# Patient Record
Sex: Female | Born: 1940 | Race: White | Hispanic: No | Marital: Married | Smoking: Former smoker
Health system: Southern US, Community
[De-identification: ages and names within clinical notes are randomized; demographics above are authoritative.]

## PROBLEM LIST (undated history)

## (undated) DIAGNOSIS — J302 Other seasonal allergic rhinitis: Secondary | ICD-10-CM

## (undated) DIAGNOSIS — E7800 Pure hypercholesterolemia, unspecified: Secondary | ICD-10-CM

## (undated) DIAGNOSIS — I10 Essential (primary) hypertension: Secondary | ICD-10-CM

## (undated) DIAGNOSIS — R7989 Other specified abnormal findings of blood chemistry: Secondary | ICD-10-CM

## (undated) DIAGNOSIS — K219 Gastro-esophageal reflux disease without esophagitis: Secondary | ICD-10-CM

## (undated) HISTORY — PX: ABDOMINAL HYSTERECTOMY: SHX81

## (undated) HISTORY — PX: TONSILLECTOMY: SUR1361

---

## 1980-09-21 HISTORY — PX: TUBAL LIGATION: SHX77

## 2014-03-09 ENCOUNTER — Other Ambulatory Visit: Payer: Self-pay | Admitting: Neurosurgery

## 2014-03-14 ENCOUNTER — Inpatient Hospital Stay (HOSPITAL_COMMUNITY): Admission: RE | Admit: 2014-03-14 | Payer: Self-pay | Source: Ambulatory Visit

## 2014-03-28 ENCOUNTER — Encounter (HOSPITAL_COMMUNITY)
Admission: RE | Admit: 2014-03-28 | Discharge: 2014-03-28 | Disposition: A | Discharge status: Home / Self Care | Payer: MEDICARE | Source: Ambulatory Visit | Attending: Neurosurgery | Admitting: Neurosurgery

## 2014-03-28 ENCOUNTER — Ambulatory Visit (HOSPITAL_COMMUNITY)
Admission: RE | Admit: 2014-03-28 | Discharge: 2014-03-28 | Disposition: A | Discharge status: Home / Self Care | Payer: MEDICARE | Source: Ambulatory Visit | Attending: Anesthesiology | Admitting: Anesthesiology

## 2014-03-28 ENCOUNTER — Encounter (HOSPITAL_COMMUNITY): Payer: Self-pay

## 2014-03-28 DIAGNOSIS — I10 Essential (primary) hypertension: Secondary | ICD-10-CM | POA: Insufficient documentation

## 2014-03-28 DIAGNOSIS — Z01818 Encounter for other preprocedural examination: Secondary | ICD-10-CM | POA: Insufficient documentation

## 2014-03-28 HISTORY — DX: Essential (primary) hypertension: I10

## 2014-03-28 HISTORY — DX: Other seasonal allergic rhinitis: J30.2

## 2014-03-28 HISTORY — DX: Gastro-esophageal reflux disease without esophagitis: K21.9

## 2014-03-28 HISTORY — DX: Other specified abnormal findings of blood chemistry: R79.89

## 2014-03-28 HISTORY — DX: Pure hypercholesterolemia, unspecified: E78.00

## 2014-03-28 LAB — CBC
HEMATOCRIT: 34.7 % — AB (ref 36.0–46.0)
HEMOGLOBIN: 11.1 g/dL — AB (ref 12.0–15.0)
MCH: 29.5 pg (ref 26.0–34.0)
MCHC: 32 g/dL (ref 30.0–36.0)
MCV: 92.3 fL (ref 78.0–100.0)
Platelets: 235 10*3/uL (ref 150–400)
RBC: 3.76 MIL/uL — AB (ref 3.87–5.11)
RDW: 14 % (ref 11.5–15.5)
WBC: 5.7 10*3/uL (ref 4.0–10.5)

## 2014-03-28 LAB — SURGICAL PCR SCREEN
MRSA, PCR: NEGATIVE
Staphylococcus aureus: POSITIVE — AB

## 2014-03-28 LAB — BASIC METABOLIC PANEL
Anion gap: 12 (ref 5–15)
BUN: 32 mg/dL — ABNORMAL HIGH (ref 6–23)
CALCIUM: 9.4 mg/dL (ref 8.4–10.5)
CHLORIDE: 102 meq/L (ref 96–112)
CO2: 25 meq/L (ref 19–32)
Creatinine, Ser: 1.42 mg/dL — ABNORMAL HIGH (ref 0.50–1.10)
GFR calc Af Amer: 42 mL/min — ABNORMAL LOW (ref >90)
GFR calc non Af Amer: 36 mL/min — ABNORMAL LOW (ref >90)
GLUCOSE: 87 mg/dL (ref 70–99)
Potassium: 4.2 mEq/L (ref 3.7–5.3)
SODIUM: 139 meq/L (ref 137–147)

## 2014-03-28 NOTE — Progress Notes (Signed)
Requested EKG, Stress Test, Echo, Last OV from [facility] [place] [place]

## 2014-03-28 NOTE — Pre-Procedure Instructions (Addendum)
Phyllis Richardson  03/28/2014   Your procedure is scheduled on:  June 14  [facility] Admitting at 05:30 AM.  Call this number if you have problems the morning of surgery: [phone]   Remember:   Do not eat food or drink liquids after midnight.   Take these medicines the morning of surgery with A SIP OF WATER: Amlodipine, Carvedilol, Nexium, Zyrtec, Advair, Proventil (if needed), Veremyst (if needed) Ultram (if needed)   STOP Aspirin, Mobic, Calcium, Excedrin, Vitamin D today   STOP/ Do not take Aspirin, Aleve, Naproxen, Advil, Ibuprofen, Motrin, Vitamins, Herbs, or Supplements starting today    Do not wear jewelry, make-up or nail polish.  Do not wear lotions, powders, or perfumes. You may wear deodorant.  Do not shave 48 hours prior to surgery. Men may shave face and neck.  Do not bring valuables to the hospital.  [facility] is not responsible for any belongings or valuables.               Contacts, dentures or bridgework may not be worn into surgery.  Leave suitcase in the car. After surgery it may be brought to your room.  For patients admitted to the hospital, discharge time is determined by your treatment team.               Special Instructions: See [facility] Preparing For Surgery   Please read over the following fact sheets that you were given: Pain Booklet, Coughing and Deep Breathing and Surgical Site Infection Prevention

## 2014-04-02 MED ORDER — CEFAZOLIN SODIUM-DEXTROSE 2-3 GM-% IV SOLR
2.0000 g | INTRAVENOUS | Status: AC
  Administered 2014-04-03: 2 g via INTRAVENOUS

## 2014-04-03 ENCOUNTER — Observation Stay (HOSPITAL_COMMUNITY)
Admission: RE | Admit: 2014-04-03 | Discharge: 2014-04-04 | Disposition: A | Discharge status: Home / Self Care | Payer: MEDICARE | Source: Ambulatory Visit | Attending: Neurosurgery | Admitting: Neurosurgery

## 2014-04-03 ENCOUNTER — Encounter (HOSPITAL_COMMUNITY)
Admission: RE | Disposition: A | Discharge status: Home / Self Care | Payer: Self-pay | Source: Ambulatory Visit | Attending: Neurosurgery

## 2014-04-03 ENCOUNTER — Encounter (HOSPITAL_COMMUNITY): Payer: MEDICARE | Admitting: Anesthesiology

## 2014-04-03 ENCOUNTER — Ambulatory Visit (HOSPITAL_COMMUNITY): Payer: MEDICARE | Admitting: Anesthesiology

## 2014-04-03 ENCOUNTER — Observation Stay (HOSPITAL_COMMUNITY): Payer: MEDICARE

## 2014-04-03 ENCOUNTER — Encounter (HOSPITAL_COMMUNITY): Payer: Self-pay | Admitting: *Deleted

## 2014-04-03 DIAGNOSIS — Z87891 Personal history of nicotine dependence: Secondary | ICD-10-CM | POA: Insufficient documentation

## 2014-04-03 DIAGNOSIS — Z7982 Long term (current) use of aspirin: Secondary | ICD-10-CM | POA: Insufficient documentation

## 2014-04-03 DIAGNOSIS — D759 Disease of blood and blood-forming organs, unspecified: Secondary | ICD-10-CM | POA: Insufficient documentation

## 2014-04-03 DIAGNOSIS — K219 Gastro-esophageal reflux disease without esophagitis: Secondary | ICD-10-CM | POA: Diagnosis not present

## 2014-04-03 DIAGNOSIS — I10 Essential (primary) hypertension: Secondary | ICD-10-CM | POA: Insufficient documentation

## 2014-04-03 DIAGNOSIS — E7800 Pure hypercholesterolemia, unspecified: Secondary | ICD-10-CM | POA: Diagnosis not present

## 2014-04-03 DIAGNOSIS — M4722 Other spondylosis with radiculopathy, cervical region: Secondary | ICD-10-CM | POA: Diagnosis present

## 2014-04-03 DIAGNOSIS — M47812 Spondylosis without myelopathy or radiculopathy, cervical region: Principal | ICD-10-CM | POA: Insufficient documentation

## 2014-04-03 HISTORY — PX: ANTERIOR CERVICAL DECOMP/DISCECTOMY FUSION: SHX1161

## 2014-04-03 SURGERY — ANTERIOR CERVICAL DECOMPRESSION/DISCECTOMY FUSION 2 LEVELS
Anesthesia: General | Site: Spine Cervical

## 2014-04-03 MED ORDER — FLUTICASONE PROPIONATE 50 MCG/ACT NA SUSP: 2.0000 | Freq: Every day | NASAL | Status: DC | PRN

## 2014-04-03 MED ORDER — GLYCOPYRROLATE 0.2 MG/ML IJ SOLN
INTRAMUSCULAR | Status: DC | PRN
  Administered 2014-04-03: 0.6 mg via INTRAVENOUS

## 2014-04-03 MED ORDER — CARVEDILOL 12.5 MG PO TABS
12.5000 mg | ORAL_TABLET | Freq: Two times a day (BID) | ORAL | Status: DC
  Administered 2014-04-03 – 2014-04-04 (×2): 12.5 mg via ORAL
  Filled 2014-04-03 (×4): qty 1

## 2014-04-03 MED ORDER — DEXAMETHASONE SODIUM PHOSPHATE 10 MG/ML IJ SOLN
INTRAMUSCULAR | Status: AC
  Filled 2014-04-03: qty 1

## 2014-04-03 MED ORDER — CEFAZOLIN SODIUM 1-5 GM-% IV SOLN
1.0000 g | Freq: Three times a day (TID) | INTRAVENOUS | Status: AC
  Administered 2014-04-03 (×2): 1 g via INTRAVENOUS
  Filled 2014-04-03 (×2): qty 50

## 2014-04-03 MED ORDER — ONDANSETRON HCL 4 MG/2ML IJ SOLN
INTRAMUSCULAR | Status: AC
  Filled 2014-04-03: qty 2

## 2014-04-03 MED ORDER — SENNA 8.6 MG PO TABS
1.0000 | ORAL_TABLET | Freq: Two times a day (BID) | ORAL | Status: DC
  Administered 2014-04-03 – 2014-04-04 (×4): 8.6 mg via ORAL
  Filled 2014-04-03 (×3): qty 1

## 2014-04-03 MED ORDER — HYDROMORPHONE HCL PF 1 MG/ML IJ SOLN
INTRAMUSCULAR | Status: AC
  Filled 2014-04-03: qty 1

## 2014-04-03 MED ORDER — DOCUSATE SODIUM 100 MG PO CAPS
100.0000 mg | ORAL_CAPSULE | Freq: Two times a day (BID) | ORAL | Status: DC
  Administered 2014-04-03 – 2014-04-04 (×4): 100 mg via ORAL
  Filled 2014-04-03 (×4): qty 1

## 2014-04-03 MED ORDER — PHENYLEPHRINE HCL 10 MG/ML IJ SOLN
INTRAMUSCULAR | Status: DC | PRN
  Administered 2014-04-03 (×7): 40 ug via INTRAVENOUS

## 2014-04-03 MED ORDER — BACITRACIN 50000 UNITS IM SOLR
INTRAMUSCULAR | Status: DC | PRN
  Administered 2014-04-03: 09:00:00

## 2014-04-03 MED ORDER — PHENYLEPHRINE 40 MCG/ML (10ML) SYRINGE FOR IV PUSH (FOR BLOOD PRESSURE SUPPORT)
PREFILLED_SYRINGE | INTRAVENOUS | Status: AC
  Filled 2014-04-03: qty 10

## 2014-04-03 MED ORDER — EPHEDRINE SULFATE 50 MG/ML IJ SOLN
INTRAMUSCULAR | Status: DC | PRN
  Administered 2014-04-03: 5 mg via INTRAVENOUS
  Administered 2014-04-03: 10 mg via INTRAVENOUS
  Administered 2014-04-03 (×3): 5 mg via INTRAVENOUS

## 2014-04-03 MED ORDER — ALBUTEROL SULFATE (2.5 MG/3ML) 0.083% IN NEBU: 2.5000 mg | INHALATION_SOLUTION | Freq: Four times a day (QID) | RESPIRATORY_TRACT | Status: DC | PRN

## 2014-04-03 MED ORDER — VECURONIUM BROMIDE 10 MG IV SOLR
INTRAVENOUS | Status: DC | PRN
  Administered 2014-04-03: 1 mg via INTRAVENOUS
  Administered 2014-04-03: 2 mg via INTRAVENOUS

## 2014-04-03 MED ORDER — OXYCODONE HCL 5 MG/5ML PO SOLN: 5.0000 mg | Freq: Once | ORAL | Status: DC | PRN

## 2014-04-03 MED ORDER — CALCIUM CARBONATE 600 MG PO TABS
600.0000 mg | ORAL_TABLET | Freq: Two times a day (BID) | ORAL | Status: DC
  Filled 2014-04-03 (×2): qty 1

## 2014-04-03 MED ORDER — LIDOCAINE HCL (CARDIAC) 20 MG/ML IV SOLN
INTRAVENOUS | Status: DC | PRN
  Administered 2014-04-03: 100 mg via INTRAVENOUS

## 2014-04-03 MED ORDER — PROMETHAZINE HCL 25 MG/ML IJ SOLN: 6.2500 mg | INTRAMUSCULAR | Status: DC | PRN

## 2014-04-03 MED ORDER — DIAZEPAM 5 MG PO TABS: 5.0000 mg | ORAL_TABLET | Freq: Four times a day (QID) | ORAL | Status: AC | PRN

## 2014-04-03 MED ORDER — VITAMIN D (ERGOCALCIFEROL) 1.25 MG (50000 UNIT) PO CAPS
50000.0000 [IU] | ORAL_CAPSULE | ORAL | Status: DC
  Filled 2014-04-03: qty 1

## 2014-04-03 MED ORDER — ROCURONIUM BROMIDE 50 MG/5ML IV SOLN
INTRAVENOUS | Status: AC
  Filled 2014-04-03: qty 1

## 2014-04-03 MED ORDER — GLYCOPYRROLATE 0.2 MG/ML IJ SOLN
INTRAMUSCULAR | Status: AC
  Filled 2014-04-03: qty 1

## 2014-04-03 MED ORDER — LOSARTAN POTASSIUM-HCTZ 100-12.5 MG PO TABS: 1.0000 | ORAL_TABLET | Freq: Every day | ORAL | Status: DC

## 2014-04-03 MED ORDER — DEXAMETHASONE SODIUM PHOSPHATE 10 MG/ML IJ SOLN
INTRAMUSCULAR | Status: DC | PRN
  Administered 2014-04-03: 10 mg via INTRAVENOUS

## 2014-04-03 MED ORDER — ASPIRIN EC 81 MG PO TBEC
81.0000 mg | DELAYED_RELEASE_TABLET | Freq: Every day | ORAL | Status: AC
Start: 2014-04-13 — End: ?

## 2014-04-03 MED ORDER — NEOSTIGMINE METHYLSULFATE 10 MG/10ML IV SOLN
INTRAVENOUS | Status: AC
  Filled 2014-04-03: qty 1

## 2014-04-03 MED ORDER — OXYCODONE HCL 5 MG PO TABS: 5.0000 mg | ORAL_TABLET | Freq: Once | ORAL | Status: DC | PRN

## 2014-04-03 MED ORDER — MIDAZOLAM HCL 2 MG/2ML IJ SOLN
INTRAMUSCULAR | Status: AC
  Filled 2014-04-03: qty 2

## 2014-04-03 MED ORDER — EZETIMIBE 10 MG PO TABS
10.0000 mg | ORAL_TABLET | Freq: Every day | ORAL | Status: DC
  Administered 2014-04-03 – 2014-04-04 (×2): 10 mg via ORAL
  Filled 2014-04-03 (×2): qty 1

## 2014-04-03 MED ORDER — GLYCOPYRROLATE 0.2 MG/ML IJ SOLN
INTRAMUSCULAR | Status: AC
  Filled 2014-04-03: qty 3

## 2014-04-03 MED ORDER — MELOXICAM 7.5 MG PO TABS
7.5000 mg | ORAL_TABLET | Freq: Every day | ORAL | Status: DC
  Administered 2014-04-03 – 2014-04-04 (×2): 7.5 mg via ORAL
  Filled 2014-04-03 (×2): qty 1

## 2014-04-03 MED ORDER — STERILE WATER FOR INJECTION IJ SOLN
INTRAMUSCULAR | Status: AC
  Filled 2014-04-03: qty 10

## 2014-04-03 MED ORDER — THROMBIN 5000 UNITS EX SOLR
OROMUCOSAL | Status: DC | PRN
  Administered 2014-04-03: 09:00:00 via TOPICAL

## 2014-04-03 MED ORDER — PANTOPRAZOLE SODIUM 40 MG PO TBEC
80.0000 mg | DELAYED_RELEASE_TABLET | Freq: Every day | ORAL | Status: DC
  Administered 2014-04-04: 80 mg via ORAL
  Filled 2014-04-03: qty 2

## 2014-04-03 MED ORDER — ALBUTEROL SULFATE HFA 108 (90 BASE) MCG/ACT IN AERS: 2.0000 | INHALATION_SPRAY | Freq: Four times a day (QID) | RESPIRATORY_TRACT | Status: DC | PRN

## 2014-04-03 MED ORDER — SODIUM CHLORIDE 0.9 % IJ SOLN
INTRAMUSCULAR | Status: AC
  Filled 2014-04-03: qty 10

## 2014-04-03 MED ORDER — MOMETASONE FURO-FORMOTEROL FUM 100-5 MCG/ACT IN AERO
2.0000 | INHALATION_SPRAY | Freq: Two times a day (BID) | RESPIRATORY_TRACT | Status: DC
  Administered 2014-04-03 – 2014-04-04 (×3): 2 via RESPIRATORY_TRACT
  Filled 2014-04-03: qty 8.8

## 2014-04-03 MED ORDER — PROPOFOL 10 MG/ML IV BOLUS
INTRAVENOUS | Status: AC
  Filled 2014-04-03: qty 20

## 2014-04-03 MED ORDER — OXYCODONE-ACETAMINOPHEN 5-325 MG PO TABS
ORAL_TABLET | ORAL | Status: AC
  Filled 2014-04-03: qty 2

## 2014-04-03 MED ORDER — ROCURONIUM BROMIDE 100 MG/10ML IV SOLN
INTRAVENOUS | Status: DC | PRN
  Administered 2014-04-03: 10 mg via INTRAVENOUS
  Administered 2014-04-03: 40 mg via INTRAVENOUS

## 2014-04-03 MED ORDER — AMLODIPINE BESYLATE 5 MG PO TABS
5.0000 mg | ORAL_TABLET | Freq: Every day | ORAL | Status: DC
  Administered 2014-04-04: 5 mg via ORAL
  Filled 2014-04-03: qty 1

## 2014-04-03 MED ORDER — PROPOFOL 10 MG/ML IV BOLUS
INTRAVENOUS | Status: DC | PRN
  Administered 2014-04-03: 130 mg via INTRAVENOUS

## 2014-04-03 MED ORDER — HYDROCHLOROTHIAZIDE 12.5 MG PO CAPS
12.5000 mg | ORAL_CAPSULE | Freq: Every day | ORAL | Status: DC
  Administered 2014-04-03 – 2014-04-04 (×2): 12.5 mg via ORAL
  Filled 2014-04-03 (×2): qty 1

## 2014-04-03 MED ORDER — EPHEDRINE SULFATE 50 MG/ML IJ SOLN
INTRAMUSCULAR | Status: AC
  Filled 2014-04-03: qty 1

## 2014-04-03 MED ORDER — HEPARIN SODIUM (PORCINE) 5000 UNIT/ML IJ SOLN
5000.0000 [IU] | Freq: Three times a day (TID) | INTRAMUSCULAR | Status: DC
  Administered 2014-04-04 (×2): 5000 [IU] via SUBCUTANEOUS
  Filled 2014-04-03 (×4): qty 1

## 2014-04-03 MED ORDER — THROMBIN 20000 UNITS EX SOLR
CUTANEOUS | Status: DC | PRN
  Administered 2014-04-03: 09:00:00 via TOPICAL

## 2014-04-03 MED ORDER — FENOFIBRATE 54 MG PO TABS
54.0000 mg | ORAL_TABLET | Freq: Every day | ORAL | Status: DC
  Administered 2014-04-03 – 2014-04-04 (×2): 54 mg via ORAL
  Filled 2014-04-03 (×2): qty 1

## 2014-04-03 MED ORDER — ACETAMINOPHEN 650 MG RE SUPP: 650.0000 mg | RECTAL | Status: DC | PRN

## 2014-04-03 MED ORDER — MIDAZOLAM HCL 5 MG/5ML IJ SOLN
INTRAMUSCULAR | Status: DC | PRN
  Administered 2014-04-03: 1 mg via INTRAVENOUS

## 2014-04-03 MED ORDER — 0.9 % SODIUM CHLORIDE (POUR BTL) OPTIME
TOPICAL | Status: DC | PRN
  Administered 2014-04-03: 1000 mL

## 2014-04-03 MED ORDER — LOSARTAN POTASSIUM 50 MG PO TABS
100.0000 mg | ORAL_TABLET | Freq: Every day | ORAL | Status: DC
  Administered 2014-04-03 – 2014-04-04 (×2): 100 mg via ORAL
  Filled 2014-04-03 (×2): qty 2

## 2014-04-03 MED ORDER — LORATADINE 10 MG PO TABS
10.0000 mg | ORAL_TABLET | Freq: Every day | ORAL | Status: DC
  Administered 2014-04-03 – 2014-04-04 (×2): 10 mg via ORAL
  Filled 2014-04-03 (×2): qty 1

## 2014-04-03 MED ORDER — SUCCINYLCHOLINE CHLORIDE 20 MG/ML IJ SOLN
INTRAMUSCULAR | Status: AC
  Filled 2014-04-03: qty 1

## 2014-04-03 MED ORDER — LIDOCAINE HCL (CARDIAC) 20 MG/ML IV SOLN
INTRAVENOUS | Status: AC
  Filled 2014-04-03: qty 5

## 2014-04-03 MED ORDER — MENTHOL 3 MG MT LOZG
1.0000 | LOZENGE | OROMUCOSAL | Status: DC | PRN
  Filled 2014-04-03: qty 9

## 2014-04-03 MED ORDER — SODIUM CHLORIDE 0.9 % IV SOLN: 250.0000 mL | INTRAVENOUS | Status: DC

## 2014-04-03 MED ORDER — OXYCODONE-ACETAMINOPHEN 5-325 MG PO TABS
1.0000 | ORAL_TABLET | ORAL | Status: DC | PRN
  Administered 2014-04-03 – 2014-04-04 (×3): 2 via ORAL
  Filled 2014-04-03 (×2): qty 2

## 2014-04-03 MED ORDER — OXYCODONE-ACETAMINOPHEN 5-325 MG PO TABS: 1.0000 | ORAL_TABLET | ORAL | Status: AC | PRN

## 2014-04-03 MED ORDER — ONDANSETRON HCL 4 MG/2ML IJ SOLN
INTRAMUSCULAR | Status: DC | PRN
  Administered 2014-04-03: 4 mg via INTRAVENOUS

## 2014-04-03 MED ORDER — DIAZEPAM 5 MG PO TABS
5.0000 mg | ORAL_TABLET | Freq: Four times a day (QID) | ORAL | Status: DC | PRN
Start: 2014-04-03 — End: 2014-04-05
  Administered 2014-04-03 – 2014-04-04 (×3): 5 mg via ORAL
  Filled 2014-04-03 (×2): qty 1

## 2014-04-03 MED ORDER — SODIUM CHLORIDE 0.9 % IJ SOLN
3.0000 mL | Freq: Two times a day (BID) | INTRAMUSCULAR | Status: DC
  Administered 2014-04-03: 3 mL via INTRAVENOUS

## 2014-04-03 MED ORDER — ONDANSETRON HCL 4 MG/2ML IJ SOLN
4.0000 mg | INTRAMUSCULAR | Status: DC | PRN
Start: 2014-04-03 — End: 2014-04-05

## 2014-04-03 MED ORDER — ACETAMINOPHEN 325 MG PO TABS: 650.0000 mg | ORAL_TABLET | ORAL | Status: DC | PRN

## 2014-04-03 MED ORDER — FENTANYL CITRATE 0.05 MG/ML IJ SOLN
INTRAMUSCULAR | Status: DC | PRN
  Administered 2014-04-03: 50 ug via INTRAVENOUS
  Administered 2014-04-03: 25 ug via INTRAVENOUS
  Administered 2014-04-03: 50 ug via INTRAVENOUS

## 2014-04-03 MED ORDER — SODIUM CHLORIDE 0.9 % IV SOLN: INTRAVENOUS | Status: DC

## 2014-04-03 MED ORDER — FENTANYL CITRATE 0.05 MG/ML IJ SOLN
INTRAMUSCULAR | Status: AC
  Filled 2014-04-03: qty 5

## 2014-04-03 MED ORDER — CALCIUM CARBONATE 1250 (500 CA) MG PO TABS
1.0000 | ORAL_TABLET | Freq: Two times a day (BID) | ORAL | Status: DC
  Administered 2014-04-03 – 2014-04-04 (×2): 500 mg via ORAL
  Filled 2014-04-03 (×4): qty 1

## 2014-04-03 MED ORDER — HYDROMORPHONE HCL PF 1 MG/ML IJ SOLN
0.2500 mg | INTRAMUSCULAR | Status: DC | PRN
  Administered 2014-04-03: 0.25 mg via INTRAVENOUS

## 2014-04-03 MED ORDER — LACTATED RINGERS IV SOLN
INTRAVENOUS | Status: DC | PRN
  Administered 2014-04-03 (×2): via INTRAVENOUS

## 2014-04-03 MED ORDER — DIAZEPAM 5 MG PO TABS
ORAL_TABLET | ORAL | Status: AC
  Filled 2014-04-03: qty 1

## 2014-04-03 MED ORDER — PHENOL 1.4 % MT LIQD
1.0000 | OROMUCOSAL | Status: DC | PRN
  Administered 2014-04-03: 1 via OROMUCOSAL
  Filled 2014-04-03: qty 177

## 2014-04-03 MED ORDER — LIDOCAINE-EPINEPHRINE 1 %-1:100000 IJ SOLN
INTRAMUSCULAR | Status: DC | PRN
  Administered 2014-04-03: 4 mL

## 2014-04-03 MED ORDER — NEOSTIGMINE METHYLSULFATE 10 MG/10ML IV SOLN
INTRAVENOUS | Status: DC | PRN
  Administered 2014-04-03: 4 mg via INTRAVENOUS

## 2014-04-03 MED ORDER — BUPIVACAINE HCL 0.5 % IJ SOLN
INTRAMUSCULAR | Status: DC | PRN
  Administered 2014-04-03: 4 mL

## 2014-04-03 MED ORDER — SODIUM CHLORIDE 0.9 % IJ SOLN: 3.0000 mL | INTRAMUSCULAR | Status: DC | PRN

## 2014-04-03 SURGICAL SUPPLY — 69 items
BAG DECANTER FOR FLEXI CONT (MISCELLANEOUS) ×3 IMPLANT
BENZOIN TINCTURE PRP APPL 2/3 (GAUZE/BANDAGES/DRESSINGS) ×3 IMPLANT
BLADE 10 SAFETY STRL DISP (BLADE) IMPLANT
BLADE SURG 11 STRL SS (BLADE) ×3 IMPLANT
BLADE SURG ROTATE 9660 (MISCELLANEOUS) IMPLANT
BNDG GAUZE ELAST 4 BULKY (GAUZE/BANDAGES/DRESSINGS) IMPLANT
BUR MATCHSTICK NEURO 3.0 LAGG (BURR) ×3 IMPLANT
CAGE PEEK VISTA-S 11X14X5MM (Cage) ×3 IMPLANT
CAGE PEEK VISTA-S CERV 11X14X5 (Cage) ×3 IMPLANT
CANISTER SUCT 3000ML (MISCELLANEOUS) ×3 IMPLANT
CLOSURE WOUND 1/2 X4 (GAUZE/BANDAGES/DRESSINGS) ×1
CONT SPEC 4OZ CLIKSEAL STRL BL (MISCELLANEOUS) ×3 IMPLANT
DECANTER SPIKE VIAL GLASS SM (MISCELLANEOUS) ×3 IMPLANT
DERMABOND ADHESIVE PROPEN (GAUZE/BANDAGES/DRESSINGS) ×2
DERMABOND ADVANCED .7 DNX6 (GAUZE/BANDAGES/DRESSINGS) ×1 IMPLANT
DRAIN CHANNEL 10M FLAT 3/4 FLT (DRAIN) IMPLANT
DRAPE C-ARM 42X72 X-RAY (DRAPES) ×6 IMPLANT
DRAPE LAPAROTOMY 100X72 PEDS (DRAPES) ×3 IMPLANT
DRAPE MICROSCOPE LEICA (MISCELLANEOUS) ×3 IMPLANT
DRAPE POUCH INSTRU U-SHP 10X18 (DRAPES) ×3 IMPLANT
DRAPE PROXIMA HALF (DRAPES) ×3 IMPLANT
DRSG OPSITE POSTOP 3X4 (GAUZE/BANDAGES/DRESSINGS) ×3 IMPLANT
DRSG TEGADERM 4X4.75 (GAUZE/BANDAGES/DRESSINGS) IMPLANT
DURAPREP 6ML APPLICATOR 50/CS (WOUND CARE) ×3 IMPLANT
ELECT COATED BLADE 2.86 ST (ELECTRODE) ×3 IMPLANT
ELECT REM PT RETURN 9FT ADLT (ELECTROSURGICAL) ×3
ELECTRODE REM PT RTRN 9FT ADLT (ELECTROSURGICAL) ×1 IMPLANT
EVACUATOR SILICONE 100CC (DRAIN) IMPLANT
GAUZE SPONGE 4X4 16PLY XRAY LF (GAUZE/BANDAGES/DRESSINGS) IMPLANT
GLOVE BIOGEL PI IND STRL 7.0 (GLOVE) ×2 IMPLANT
GLOVE BIOGEL PI IND STRL 7.5 (GLOVE) ×1 IMPLANT
GLOVE BIOGEL PI INDICATOR 7.0 (GLOVE) ×4
GLOVE BIOGEL PI INDICATOR 7.5 (GLOVE) ×2
GLOVE ECLIPSE 7.0 STRL STRAW (GLOVE) ×3 IMPLANT
GLOVE ECLIPSE 8.0 STRL XLNG CF (GLOVE) ×3 IMPLANT
GLOVE EXAM NITRILE LRG STRL (GLOVE) IMPLANT
GLOVE EXAM NITRILE MD LF STRL (GLOVE) IMPLANT
GLOVE EXAM NITRILE XL STR (GLOVE) IMPLANT
GLOVE EXAM NITRILE XS STR PU (GLOVE) IMPLANT
GLOVE SURG SS PI 7.0 STRL IVOR (GLOVE) ×9 IMPLANT
GOWN STRL REUS W/ TWL LRG LVL3 (GOWN DISPOSABLE) ×2 IMPLANT
GOWN STRL REUS W/ TWL XL LVL3 (GOWN DISPOSABLE) ×2 IMPLANT
GOWN STRL REUS W/TWL 2XL LVL3 (GOWN DISPOSABLE) IMPLANT
GOWN STRL REUS W/TWL LRG LVL3 (GOWN DISPOSABLE) ×4
GOWN STRL REUS W/TWL XL LVL3 (GOWN DISPOSABLE) ×4
HEMOSTAT POWDER KIT SURGIFOAM (HEMOSTASIS) ×3 IMPLANT
KIT BASIN OR (CUSTOM PROCEDURE TRAY) ×3 IMPLANT
KIT ROOM TURNOVER OR (KITS) ×3 IMPLANT
NEEDLE HYPO 25X1 1.5 SAFETY (NEEDLE) ×3 IMPLANT
NEEDLE SPNL 22GX3.5 QUINCKE BK (NEEDLE) ×3 IMPLANT
NS IRRIG 1000ML POUR BTL (IV SOLUTION) ×3 IMPLANT
PACK LAMINECTOMY NEURO (CUSTOM PROCEDURE TRAY) ×3 IMPLANT
PAD ARMBOARD 7.5X6 YLW CONV (MISCELLANEOUS) ×12 IMPLANT
PLATE INVIZIA 2 LEV 38MM (Plate) ×3 IMPLANT
PUTTY BONE GRAFT KIT 2.5ML (Bone Implant) ×3 IMPLANT
RUBBERBAND STERILE (MISCELLANEOUS) ×6 IMPLANT
SCREW SELF DRILL VAR 12MM (Screw) ×18 IMPLANT
SPONGE INTESTINAL PEANUT (DISPOSABLE) ×3 IMPLANT
SPONGE SURGIFOAM ABS GEL 100 (HEMOSTASIS) ×3 IMPLANT
STRIP CLOSURE SKIN 1/2X4 (GAUZE/BANDAGES/DRESSINGS) ×2 IMPLANT
SUT ETHILON 3 0 FSL (SUTURE) IMPLANT
SUT VIC AB 3-0 SH 8-18 (SUTURE) ×3 IMPLANT
SUT VICRYL 3-0 RB1 18 ABS (SUTURE) ×6 IMPLANT
SYR 20ML ECCENTRIC (SYRINGE) ×3 IMPLANT
TAPE CLOTH 3X10 TAN LF (GAUZE/BANDAGES/DRESSINGS) ×3 IMPLANT
TOWEL OR 17X24 6PK STRL BLUE (TOWEL DISPOSABLE) ×3 IMPLANT
TOWEL OR 17X26 10 PK STRL BLUE (TOWEL DISPOSABLE) ×3 IMPLANT
TRAP SPECIMEN MUCOUS 40CC (MISCELLANEOUS) ×3 IMPLANT
WATER STERILE IRR 1000ML POUR (IV SOLUTION) ×3 IMPLANT

## 2014-04-03 NOTE — Anesthesia Postprocedure Evaluation (Signed)
  Anesthesia Post-op Note  Patient: Phyllis Richardson  Procedure(s) Performed: Procedure(s): CERVICAL FIVE-SIX,CERVICAL SIX-SEVEN ANTERIOR CERVICAL DECOMPRESSION WITH FUSION INTERBODY PROSTHESIS PLATING AND BONEGRAFT. (N/A)  Patient Location: PACU  Anesthesia Type:General  Level of Consciousness: awake and alert   Airway and Oxygen Therapy: Patient Spontanous Breathing  Post-op Pain: mild  Post-op Assessment: Post-op Vital signs reviewed  Post-op Vital Signs: stable  Last Vitals:  Filed Vitals:   04/03/14 1145  BP:   Pulse: 81  Temp: 36.7 C  Resp: 15    Complications: No apparent anesthesia complications

## 2014-04-03 NOTE — Anesthesia Procedure Notes (Signed)
Procedure Name: Intubation Date/Time: 04/03/2014 7:37 AM Performed by: Lovie CholOCK, Emmersyn Kratzke K Pre-anesthesia Checklist: Patient identified, Emergency Drugs available, Suction available, Patient being monitored and Timeout performed Patient Re-evaluated:Patient Re-evaluated prior to inductionOxygen Delivery Method: Circle system utilized Preoxygenation: Pre-oxygenation with 100% oxygen Intubation Type: IV induction Ventilation: Mask ventilation without difficulty and Oral airway inserted - appropriate to patient size Laryngoscope Size: Miller and 2 Grade View: Grade I Tube type: Oral Tube size: 7.0 mm Number of attempts: 1 Airway Equipment and Method: Stylet Placement Confirmation: ETT inserted through vocal cords under direct vision,  positive ETCO2,  CO2 detector and breath sounds checked- equal and bilateral Secured at: 21 cm Tube secured with: Tape Dental Injury: Teeth and Oropharynx as per pre-operative assessment

## 2014-04-03 NOTE — Discharge Summary (Signed)
Physician Discharge Summary  Patient ID: Phyllis Richardson MRN: [id number] DOB/AGE: [birth year] 73 y.o.  Admit date: 04/03/2014 Discharge date: 04/03/2014  Admission Diagnoses: Cervical spondylosis with radiculopathy, C5-6, C6-7  Discharge Diagnoses: Same Active Problems:   Cervical spondylosis with radiculopathy   Discharged Condition: Stable  Hospital Course:  Phyllis Richardson is a 73 y.o. female electively admitted after uncomplicated C5-6 ACDF. She was at her neurologic baseline postoperatively, reporting resolution of her left neck and shoulder pain and decreased numbness in her left hand. She is tolerating diet, voiding normally, and ambulating and therefore stable for D/C.  Treatments: Surgery - ACDF C5-6, C6-7  Discharge Exam: Blood pressure 121/68, pulse 88, temperature 97.7 F (36.5 C), temperature source Oral, resp. rate 16, height 5\' 3"  (1.6 m), weight 87.998 kg (194 lb), SpO2 94.00%. Awake, alert, oriented Speech fluent, appropriate CN grossly intact 5/5 BUE/BLE x 4/5 Left tricep Wound c/d/i  Follow-up: Follow-up in my office [facility] [phone]) in 2-3 weeks  Disposition: Home     Medication List         albuterol 108 (90 BASE) MCG/ACT inhaler  Commonly known as:  PROVENTIL HFA;VENTOLIN HFA  Inhale 2 puffs into the lungs every 6 (six) hours as needed for wheezing or shortness of breath.     amLODipine 5 MG tablet  Commonly known as:  NORVASC  Take 5 mg by mouth daily.     aspirin EC 81 MG tablet  Take 1 tablet (81 mg total) by mouth daily.  Start taking on:  04/13/2014     calcium carbonate 600 MG Tabs tablet  Commonly known as:  OS-CAL  Take 600 mg by mouth 2 (two) times daily with a meal.     carvedilol 12.5 MG tablet  Commonly known as:  COREG  Take 12.5 mg by mouth 2 (two) times daily with a meal.     cetirizine 10 MG tablet  Commonly known as:  ZYRTEC  Take 10 mg by mouth daily.     diazepam 5 MG tablet  Commonly  known as:  VALIUM  Take 1 tablet (5 mg total) by mouth every 6 (six) hours as needed for muscle spasms.     esomeprazole 40 MG capsule  Commonly known as:  NEXIUM  Take 40 mg by mouth daily at 12 noon.     EXCEDRIN PO  Take 2 tablets by mouth 2 (two) times daily as needed.     ezetimibe 10 MG tablet  Commonly known as:  ZETIA  Take 10 mg by mouth at bedtime.     fenofibrate 54 MG tablet  Take 54 mg by mouth at bedtime.     fluticasone 27.5 MCG/SPRAY nasal spray  Commonly known as:  VERAMYST  Place 2 sprays into the nose daily as needed for rhinitis.     Fluticasone-Salmeterol 250-50 MCG/DOSE Aepb  Commonly known as:  ADVAIR  Inhale 1 puff into the lungs 2 (two) times daily.     HYDROcodone-acetaminophen 5-325 MG per tablet  Commonly known as:  NORCO/VICODIN  Take 1 tablet by mouth every 6 (six) hours as needed for moderate pain.     losartan-hydrochlorothiazide 100-12.5 MG per tablet  Commonly known as:  HYZAAR  Take 1 tablet by mouth daily.     meloxicam 7.5 MG tablet  Commonly known as:  MOBIC  Take 7.5 mg by mouth daily.     oxyCODONE-acetaminophen 5-325 MG per tablet  Commonly known as:  PERCOCET/ROXICET  Take 1-2 tablets  by mouth every 4 (four) hours as needed for moderate pain.     PRESCRIPTION MEDICATION  Inject 1 application into the muscle once a week. Allergy injection     PRESCRIPTION MEDICATION  Place 1 drop into both eyes daily as needed (Allergy eye drops. for allergies).     traMADol 50 MG tablet  Commonly known as:  ULTRAM  Take 50 mg by mouth every 6 (six) hours as needed for moderate pain.     Vitamin D (Ergocalciferol) 50000 UNITS Caps capsule  Commonly known as:  DRISDOL  Take 50,000 Units by mouth 2 (two) times a week.         SignedLisbeth Renshaw: Jacorian Golaszewski, C 04/03/2014, 2:37 PM

## 2014-04-03 NOTE — Anesthesia Preprocedure Evaluation (Addendum)
Anesthesia Evaluation  Patient identified by MRN, date of birth, ID band Patient awake    Reviewed: Allergy & Precautions, H&P , NPO status , Patient's Chart, lab work & pertinent test results, reviewed documented beta blocker date and time   History of Anesthesia Complications Negative for: history of anesthetic complications  Airway Mallampati: II TM Distance: >3 FB Neck ROM: Full    Dental  (+) Teeth Intact, Dental Advisory Given   Pulmonary neg pulmonary ROS, former smoker,  breath sounds clear to auscultation        Cardiovascular hypertension, Pt. on medications and Pt. on home beta blockers Rhythm:Regular Rate:Normal  Cardiac studies on chart and reviewed   Neuro/Psych    GI/Hepatic Neg liver ROS, GERD-  Medicated and Controlled,  Endo/Other  Morbid obesity  Renal/GU Renal InsufficiencyRenal disease     Musculoskeletal   Abdominal (+) + obese,   Peds  Hematology  (+) Blood dyscrasia, anemia ,   Anesthesia Other Findings   Reproductive/Obstetrics negative OB ROS                          Anesthesia Physical Anesthesia Plan  ASA: II  Anesthesia Plan: General   Post-op Pain Management:    Induction: Intravenous  Airway Management Planned: Oral ETT  Additional Equipment:   Intra-op Plan:   Post-operative Plan: Extubation in OR  Informed Consent: I have reviewed the patients History and Physical, chart, labs and discussed the procedure including the risks, benefits and alternatives for the proposed anesthesia with the patient or authorized representative who has indicated his/her understanding and acceptance.   Dental advisory given  Plan Discussed with: CRNA and Surgeon  Anesthesia Plan Comments:         Anesthesia Quick Evaluation

## 2014-04-03 NOTE — H&P (Signed)
CC:  Neck and left arm pain  HPI: Phyllis PilgrimJoan Richardson is a 73 y.o. female initially seen in the outpatient neurosurgery clinic with a primary complaint of left-sided neck and arm pain. She describes it as a "catch in her neck" along with the pain which runs down the back of her left shoulder and arm, and pain on the radial side of her left forearm. She also has numbness in the first 3 digits of her left hand. She did not have significant symptoms in the right hand. She's not had any significant weakness of the arms or legs, no significant trouble walking or imbalance, and no changes in bowel or bladder function.\  After consultation in the office, treatment options were discussed, and the patient now presents for elective cervical discectomy and fusion.  PMH: Past Medical History  Diagnosis Date  . Seasonal allergies   . Hypercholesteremia   . Hypertension     Dr. Daryel NovemberGary Miller in [place]  . GERD (gastroesophageal reflux disease)   . Low serum vitamin D     PSH: Past Surgical History  Procedure Laterality Date  . Abdominal hysterectomy    . Tubal ligation  1982  . Tonsillectomy      at age 73    SH: History  Substance Use Topics  . Smoking status: Former Games developermoker  . Smokeless tobacco: Not on file  . Alcohol Use: No    MEDS: Prior to Admission medications   Medication Sig Start Date End Date Taking? Authorizing Provider  albuterol (PROVENTIL HFA;VENTOLIN HFA) 108 (90 BASE) MCG/ACT inhaler Inhale 2 puffs into the lungs every 6 (six) hours as needed for wheezing or shortness of breath.   Yes Historical Provider, MD  amLODipine (NORVASC) 5 MG tablet Take 5 mg by mouth daily.   Yes Historical Provider, MD  aspirin EC 81 MG tablet Take 81 mg by mouth daily.   Yes Historical Provider, MD  Aspirin-Acetaminophen-Caffeine (EXCEDRIN PO) Take 2 tablets by mouth 2 (two) times daily as needed.   Yes Historical Provider, MD  calcium carbonate (OS-CAL) 600 MG TABS tablet Take 600 mg by mouth 2 (two)  times daily with a meal.   Yes Historical Provider, MD  carvedilol (COREG) 12.5 MG tablet Take 12.5 mg by mouth 2 (two) times daily with a meal.   Yes Historical Provider, MD  cetirizine (ZYRTEC) 10 MG tablet Take 10 mg by mouth daily.   Yes Historical Provider, MD  esomeprazole (NEXIUM) 40 MG capsule Take 40 mg by mouth daily at 12 noon.   Yes Historical Provider, MD  ezetimibe (ZETIA) 10 MG tablet Take 10 mg by mouth at bedtime.   Yes Historical Provider, MD  fenofibrate 54 MG tablet Take 54 mg by mouth at bedtime.   Yes Historical Provider, MD  Fluticasone-Salmeterol (ADVAIR) 250-50 MCG/DOSE AEPB Inhale 1 puff into the lungs 2 (two) times daily.   Yes Historical Provider, MD  HYDROcodone-acetaminophen (NORCO/VICODIN) 5-325 MG per tablet Take 1 tablet by mouth every 6 (six) hours as needed for moderate pain.   Yes Historical Provider, MD  losartan-hydrochlorothiazide (HYZAAR) 100-12.5 MG per tablet Take 1 tablet by mouth daily.   Yes Historical Provider, MD  meloxicam (MOBIC) 7.5 MG tablet Take 7.5 mg by mouth daily.   Yes Historical Provider, MD  PRESCRIPTION MEDICATION Inject 1 application into the muscle once a week. Allergy injection   Yes Historical Provider, MD  traMADol (ULTRAM) 50 MG tablet Take 50 mg by mouth every 6 (six) hours as needed  for moderate pain.   Yes Historical Provider, MD  Vitamin D, Ergocalciferol, (DRISDOL) 50000 UNITS CAPS capsule Take 50,000 Units by mouth 2 (two) times a week.   Yes Historical Provider, MD  fluticasone (VERAMYST) 27.5 MCG/SPRAY nasal spray Place 2 sprays into the nose daily as needed for rhinitis.    Historical Provider, MD  PRESCRIPTION MEDICATION Place 1 drop into both eyes daily as needed (Allergy eye drops. for allergies).    Historical Provider, MD    ALLERGY: No Known Allergies  ROS: ROS  NEUROLOGIC EXAM: Awake, alert, oriented Memory and concentration grossly intact Speech fluent, appropriate CN grossly intact Motor exam: Upper  Extremities Deltoid Bicep Tricep Grip  Right 5/5 5/5 5/5 5/5  Left 5/5 5/5 4/5 5/5   Lower Extremity IP Quad PF DF EHL  Right 5/5 5/5 5/5 5/5 5/5  Left 5/5 5/5 5/5 5/5 5/5   Sensation grossly intact to LT  IMGAING: MRI of the cervical spine demonstrates normal cervical alignment. There is a large left eccentric disc herniation at C6 C7 causing severe spinal stenosis, and radiographic cord compression with flattening of the cord and displacement towards the right. There is also moderate to severe foraminal stenosis on the left at this level. At C5 C6 there is left eccentric disc osteophyte complex with moderate to severe left-sided foraminal stenosis.  IMPRESSION: - 73 y.o. female with C6 and C7 radiculopathies, and radiographic cord compression with severe central stenosis at C6 C7, and left-sided foraminal stenosis at C5 C6 and C6 C7.  PLAN: - ACDF at C5 C6 and C6 C7  I reviewed the MRI findings with the patient and her sister and husband. I explained to them that given the degree of radiographic cord compression, I did recommend surgical decompression.  The risks of surgery were discussed in detail with the patient which include but are not limited to spinal cord injury which may result in hand, leg, and bowel dysfunction, postoperative dysphagia, dysphonia, neck hematoma, or subsequent surgery for epidural hematoma. The risk of CSF leak was also discussed. In addition, explained the possibility of undergoing decompression with persistence of the pain. The patient and her family understood our discussion as well as the risks of the surgery and is willing to proceed. All questions were answered.

## 2014-04-03 NOTE — Plan of Care (Signed)
Problem: Consults Goal: Diagnosis - Spinal Surgery Outcome: Completed/Met Date Met:  04/03/14 Cervical Spine Fusion

## 2014-04-03 NOTE — Op Note (Signed)
PREOP DIAGNOSIS: Cervical Spondylosis with radiculopathy, C5-6, C6-7  POSTOP DIAGNOSIS: Same  PROCEDURE: 1. Discectomy at C5-6, C6-7 for decompression of spinal cord and exiting nerve roots  2. Placement of intervertebral biomechanical device, Zimmer PEEK 5mm parallel at C5-6, Zimmer PEEK 5mm lordotic at C6-7 3. Placement of anterior instrumentation consisting of interbody plate and screws spanning C5-C7 (37mm plate, 12mm screws x 6) 4. Use of morselized bone allograft  5. Arthrodesis C5-6, C6-7, anterior interbody technique  6. Use of intraoperative microscope  SURGEON: Dr. Lisbeth RenshawNeelesh Antoine Vandermeulen, MD  ASSISTANT: Aliene Beamsandy Kritzer, MD  ANESTHESIA: General Endotracheal  EBL: 50cc  SPECIMENS: None  DRAINS: None  COMPLICATIONS: None immediate  CONDITION: Hemodynamically stable to PACU  HISTORY: Phyllis PilgrimJoan Pelot is a 73 y.o. woman who initially presented with neck and left arm pain consistent with a C6 and C7 radiculopathy, with MRI demonstrating C5-6 and C6-7 disc herniations including severe foraminal stenosis at the upper level, and cord compression at the lower level. Treatment options were discussed, and the patient elected to proceed with surgical decompression. The risks and benefits of the surgery were explained in detail to the patient and her family. After all her questions were answered, verbal and written consent was obtained and placed in the chart.  PROCEDURE IN DETAIL: The patient was brought to the operating room and transferred to the operative table. After induction of general anesthesia, the patient was positioned on the operative table in the supine position with all pressure points meticulously padded. The skin of the neck was then prepped and draped in the usual sterile fashion.  After timeout was conducted, the skin was infiltrated with local anesthetic. Skin incision was then made sharply and Bovie electrocautery was used to dissect the subcutaneous tissue until the platysma  was identified. The platysma was then divided and undermined. The sternocleidomastoid muscle was then identified and, utilizing natural fascial planes in the neck, the prevertebral fascia was identified and the carotid sheath was retracted laterally and the trachea and esophagus retracted medially. Again using fluoroscopy, the correct disc spaces were identified. Bovie electrocautery was used to dissect in the subperiosteal plane and elevate the bilateral longus coli muscles. Self-retaining retractors were then placed. At this point, the microscope was draped and brought into the field, and the remainder of the case was done under the microscope using microdissecting technique.  The C5-6 disc space was incised sharply and rongeurs were use to initially complete a discectomy. The high-speed drill was then used to complete discectomy until the posterior annulus was identified and removed and the posterior longitudinal ligament was identified. Using a nerve hook, the PLL was elevated, and Kerrison rongeurs were used to remove the posterior longitudinal ligament and the ventral thecal sac was identified. Using a combination of curettes and rongeurs, complete decompression of the thecal sac and exiting nerve roots at this level was completed, and verified using micro-nerve hook. Of note, there was herniated disc fragments in the left foramen, which was patent after discectomy.  Attention was then turned to the C6-7 level. In a similar fashion, discectomy was completed initially with curettes and rongeurs, and completed with the drill. The PLL was again identified, elevated and incised. Using Kerrison rongeurs, decompression of the spinal cord and exiting roots at the C6-7 level was completed and confirmed with a dissector. At this level, of note, there was a large extruded disc fragment eccentric to left and the central canal extending into the foramen which was completely removed.  A 5 mm parallel  interbody cage  was then sized and filled with bone allograft, and tapped into place at the C5-6 level. A 5 mm lordotic interbody cage was then sized and tapped into place after it was filled with bone allograft at the C6-7 level. Position of the interbody devices was then confirmed with fluoroscopy.  After placement of the intervertebral devices, the anterior cervical plate was selected, and placed across the interspaces. Using a high-speed drill, the cortex of the cervical vertebral bodies was punctured, and screws inserted in the C5, C6, and C7 levels. Final fluoroscopic images in AP and lateral projections were taken to confirm good hardware placement.  At this point, after all counts were verified to be correct, meticulous hemostasis was secured using a combination of bipolar electrocautery and passive hemostatics. The platysma muscle was then closed using interrupted 3-0 Vicryl sutures, and the skin was closed with a interrupted 3-0 Vicryl subcuticular stitch. Dermabond was then applied.  The patient tolerated the procedure well and was extubated in the room and taken to the postanesthesia care unit in stable condition.

## 2014-04-03 NOTE — Transfer of Care (Signed)
Immediate Anesthesia Transfer of Care Note  Patient: Phyllis PilgrimJoan Kenton  Procedure(s) Performed: Procedure(s): CERVICAL FIVE-SIX,CERVICAL SIX-SEVEN ANTERIOR CERVICAL DECOMPRESSION WITH FUSION INTERBODY PROSTHESIS PLATING AND BONEGRAFT. (N/A)  Patient Location: PACU  Anesthesia Type:General  Level of Consciousness: awake, alert , oriented and patient cooperative  Airway & Oxygen Therapy: Patient Spontanous Breathing and Patient connected to nasal cannula oxygen  Post-op Assessment: Report given to PACU RN and Post -op Vital signs reviewed and stable  Post vital signs: Reviewed  Complications: No apparent anesthesia complications

## 2014-04-04 MED ORDER — MAGNESIUM CITRATE PO SOLN
1.0000 | Freq: Once | ORAL | Status: AC
  Administered 2014-04-04: 1 via ORAL
  Filled 2014-04-04: qty 296

## 2014-04-04 NOTE — Progress Notes (Signed)
Discharge instructions given to patient and verbalized understanding. 

## 2014-04-04 NOTE — Progress Notes (Signed)
Utilization review completed.  

## 2014-04-04 NOTE — Discharge Instructions (Signed)
Wound Care Keep incision covered and dry for two days.   Do not put any creams, lotions, or ointments on incision. Leave steri-strips on neck.  They will fall off by themselves. Activity Walk each and every day, increasing distance each day. No lifting greater than 5 lbs.  Avoid excessive neck motion. No driving for 2 weeks; may ride as a passenger locally. Keep neck brace on at all times except for showers only. Diet Resume your normal diet.  Return to Work Will be discussed at you follow up appointment. Call Your Doctor If Any of These Occur Redness, drainage, or swelling at the wound.  Temperature greater than 101 degrees. Severe pain not relieved by pain medication. Incision starts to come apart. Follow Up Appt Call today for appointment in 1-2 weeks [phone]) or for problems.  If you have any hardware placed in your spine, you will need an x-ray before your appointment.

## 2014-04-04 NOTE — Progress Notes (Signed)
Patient unable to empty her bladder fully, voided with a PVR . In & out cath done as ordered PRN and drained of amber urine. Will continue to monitor.

## 2014-04-06 ENCOUNTER — Encounter (HOSPITAL_COMMUNITY): Payer: Self-pay | Admitting: Neurosurgery

## 2015-09-03 IMAGING — CR DG CHEST 2V
2 series · 2 of 2 positions shown · non-contrast
Comparison: None

CLINICAL DATA: Hypertension, preop for cervical spine fusion

EXAM:
CHEST  2 VIEW

[w chest pa]
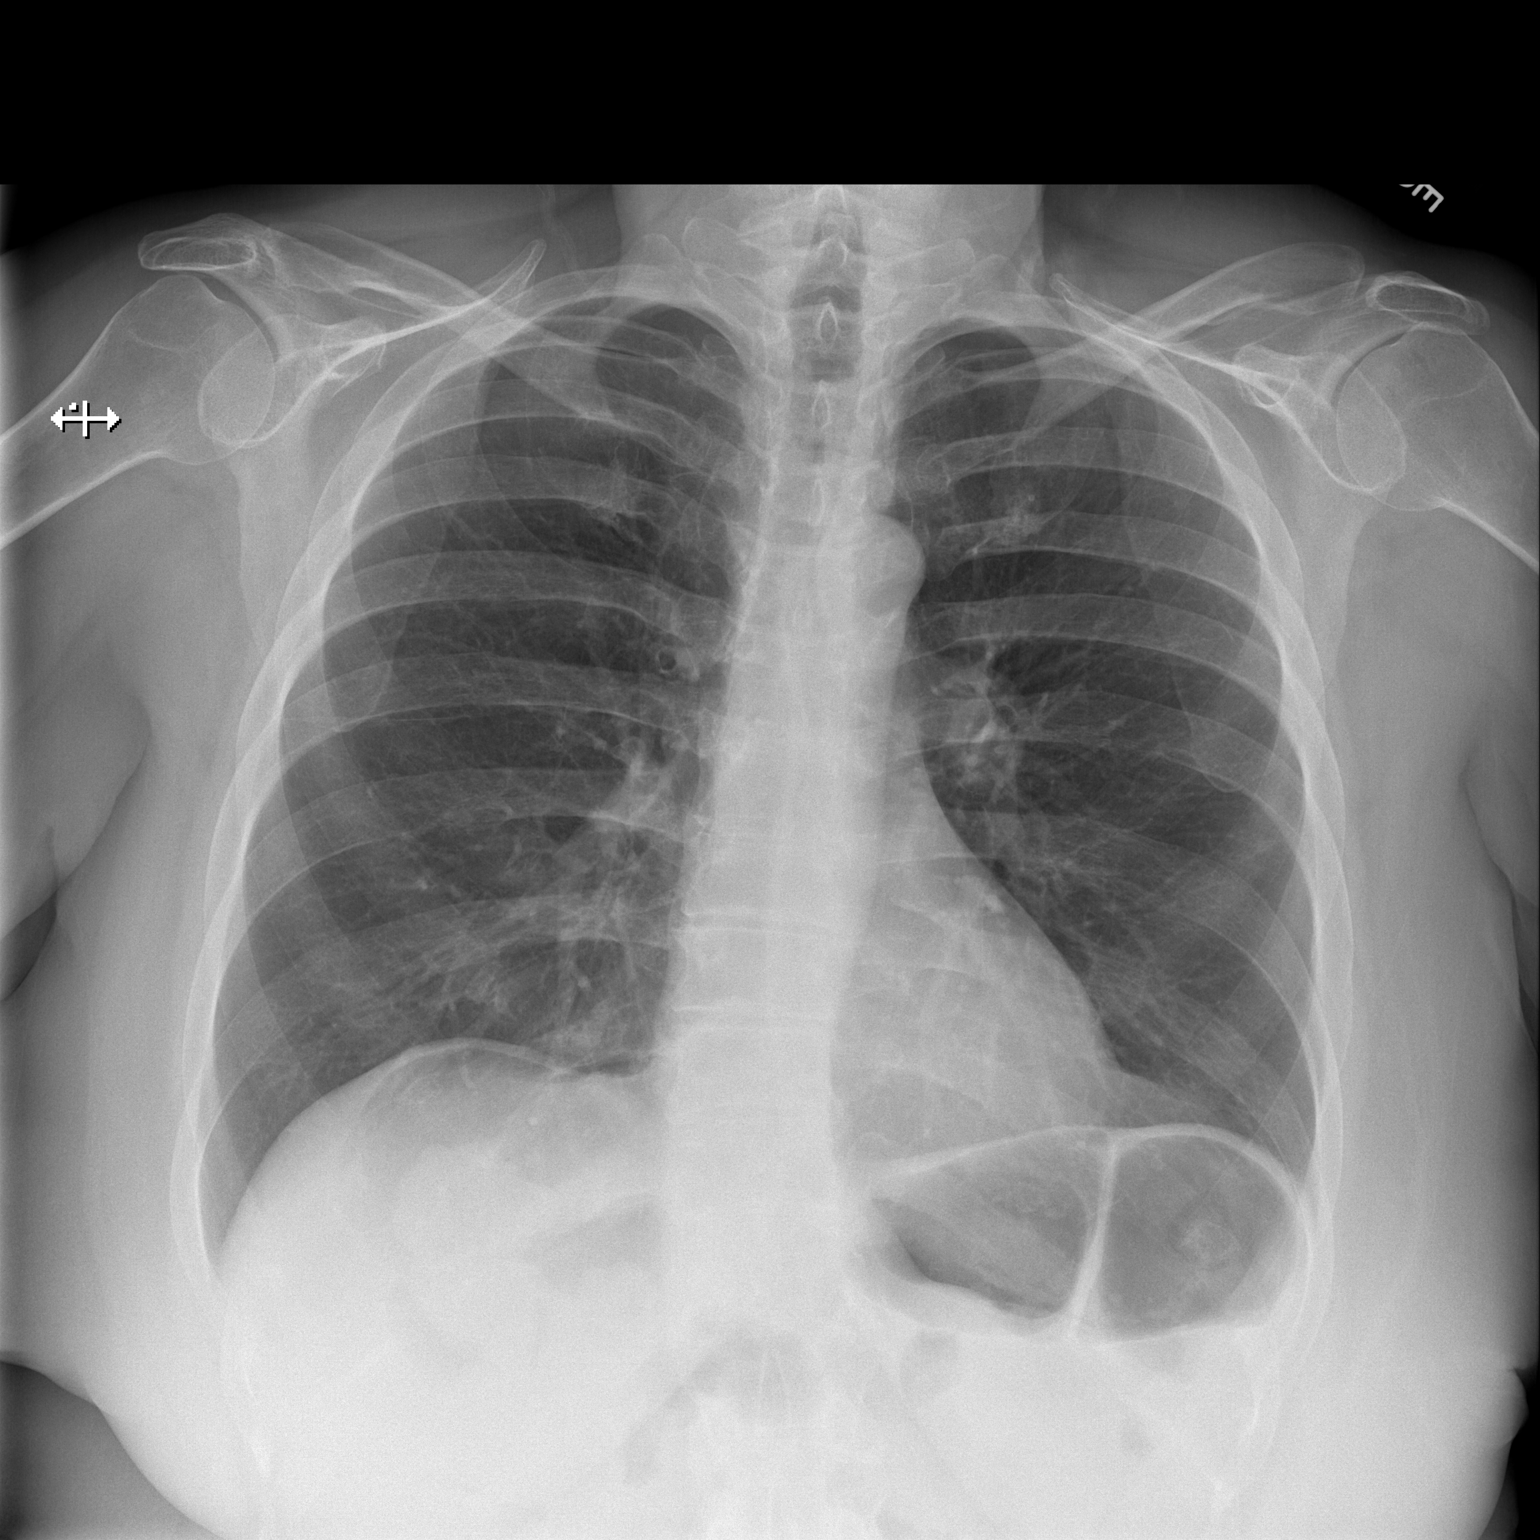

[w chest lat]
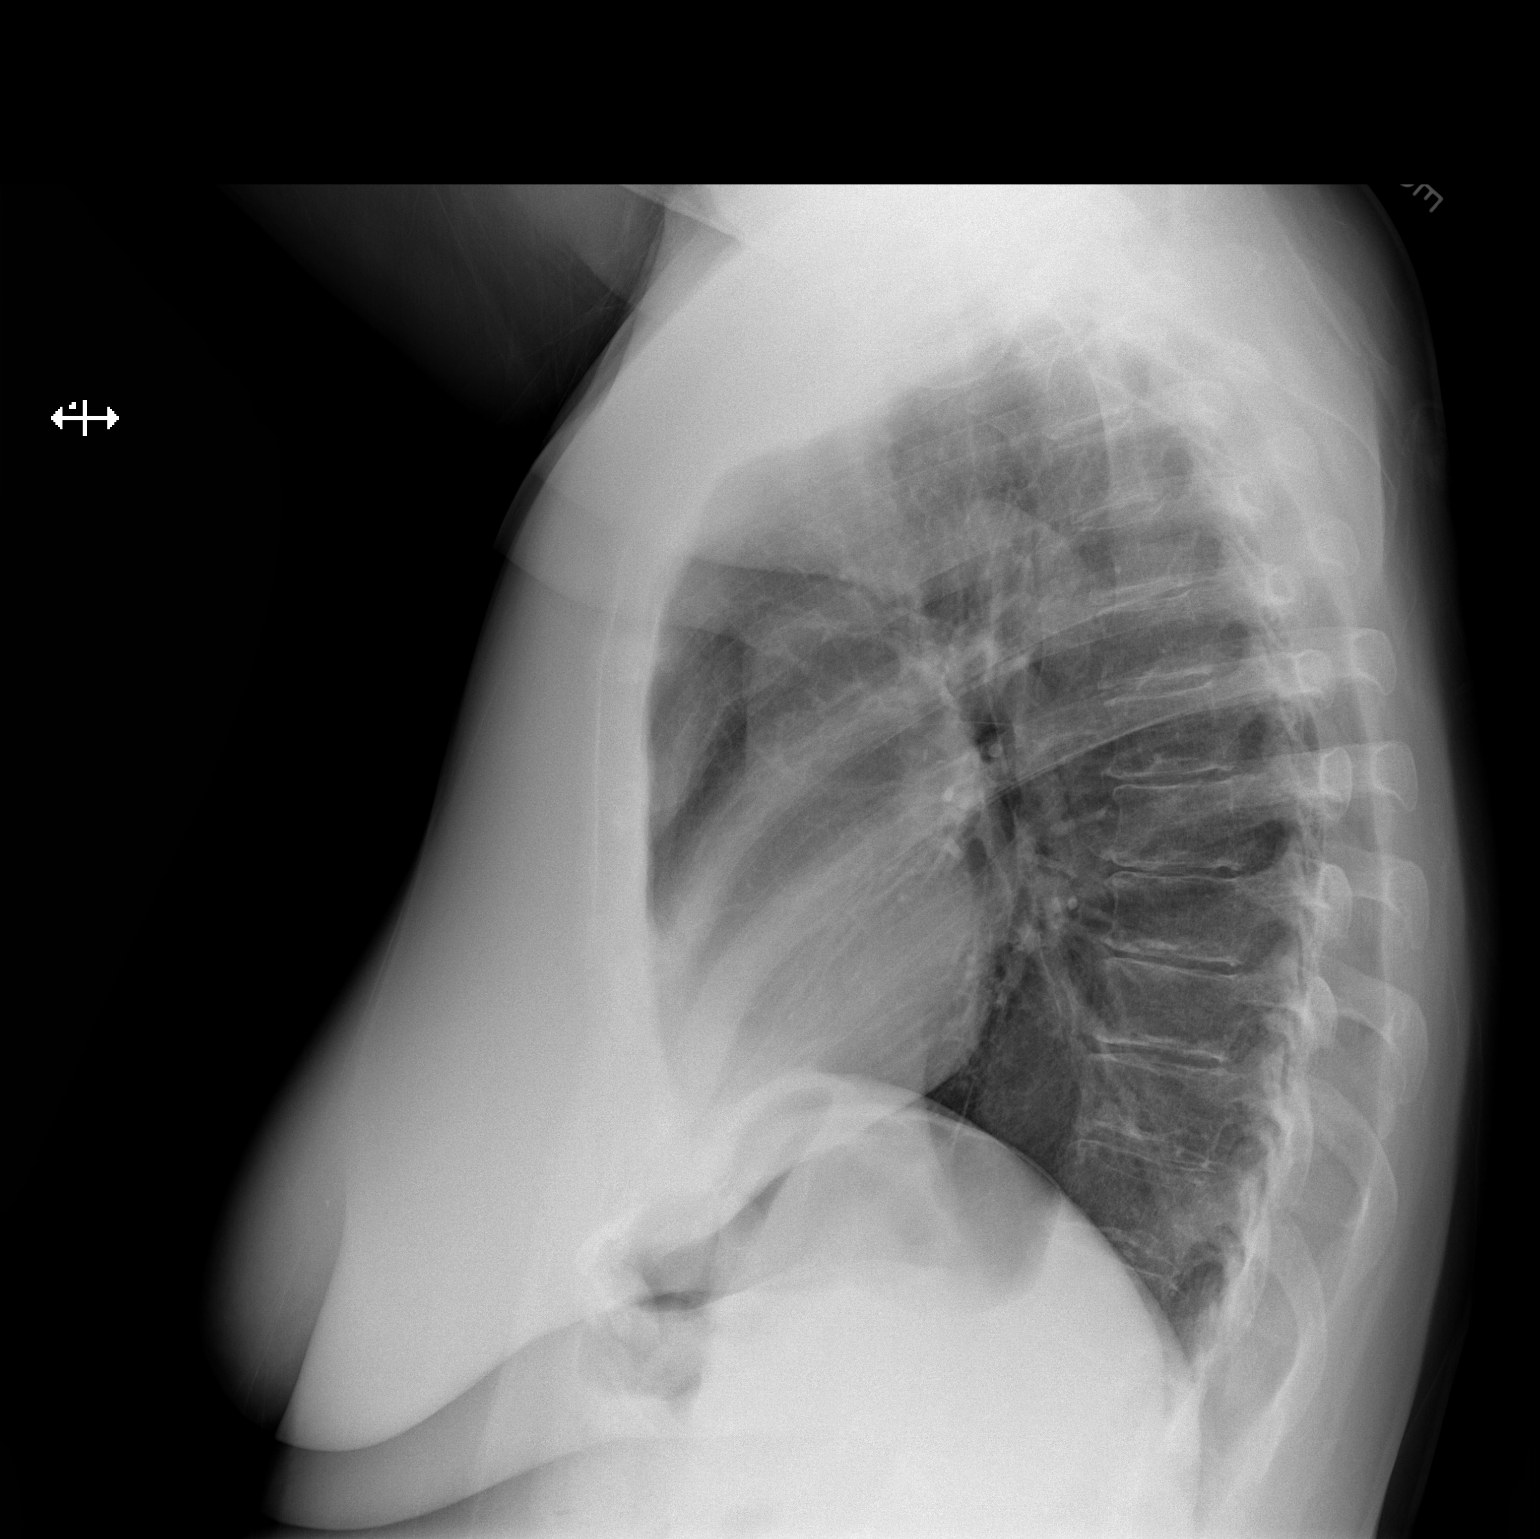

[2 of 2 positions shown; findings below may reference images not displayed]

FINDINGS: No active infiltrate or effusion is seen. Mediastinal and hilar
contours appear normal. Mild peribronchial thickening is noted. The
heart is within normal limits in size. No bony abnormality is seen.
IMPRESSION: No active cardiopulmonary disease.

## 2020-07-25 ENCOUNTER — Encounter: Payer: Self-pay | Admitting: Internal Medicine

## 2020-09-18 ENCOUNTER — Ambulatory Visit: Payer: Medicare Other | Admitting: Nurse Practitioner

## 2021-05-22 DEATH — deceased
